# Patient Record
Sex: Male | Born: 1980 | Race: Black or African American | Hispanic: No | Marital: Single | Smoking: Current some day smoker
Health system: Southern US, Community
[De-identification: ages and names within clinical notes are randomized; demographics above are authoritative.]

## PROBLEM LIST (undated history)

## (undated) DIAGNOSIS — I10 Essential (primary) hypertension: Secondary | ICD-10-CM

## (undated) HISTORY — PX: BACK SURGERY: SHX140

## (undated) HISTORY — PX: HERNIA REPAIR: SHX51

---

## 2005-11-02 ENCOUNTER — Emergency Department: Payer: Self-pay | Admitting: Emergency Medicine

## 2007-04-30 ENCOUNTER — Emergency Department: Payer: Self-pay | Admitting: Emergency Medicine

## 2007-12-01 ENCOUNTER — Emergency Department: Payer: Self-pay | Admitting: Emergency Medicine

## 2008-07-08 ENCOUNTER — Emergency Department: Payer: Self-pay | Admitting: Emergency Medicine

## 2012-02-04 ENCOUNTER — Emergency Department: Payer: Self-pay | Admitting: *Deleted

## 2012-04-28 ENCOUNTER — Inpatient Hospital Stay: Payer: Self-pay | Admitting: Internal Medicine

## 2012-04-28 LAB — CBC
HCT: 44.1 % (ref 40.0–52.0)
HGB: 14.2 g/dL (ref 13.0–18.0)
MCH: 29.1 pg (ref 26.0–34.0)
MCHC: 32.2 g/dL (ref 32.0–36.0)
RBC: 4.87 10*6/uL (ref 4.40–5.90)

## 2012-04-28 LAB — BASIC METABOLIC PANEL
BUN: 19 mg/dL — ABNORMAL HIGH (ref 7–18)
Chloride: 107 mmol/L (ref 98–107)
Co2: 25 mmol/L (ref 21–32)
Creatinine: 1 mg/dL (ref 0.60–1.30)
EGFR (African American): >60
EGFR (Non-African Amer.): >60

## 2012-04-30 LAB — BASIC METABOLIC PANEL
Calcium, Total: 9.1 mg/dL (ref 8.5–10.1)
Co2: 28 mmol/L (ref 21–32)
Creatinine: 1.06 mg/dL (ref 0.60–1.30)
EGFR (African American): >60
EGFR (Non-African Amer.): >60
Glucose: 188 mg/dL — ABNORMAL HIGH (ref 65–99)
Osmolality: 287 (ref 275–301)
Potassium: 4 mmol/L (ref 3.5–5.1)
Sodium: 139 mmol/L (ref 136–145)

## 2014-08-14 ENCOUNTER — Emergency Department: Payer: Self-pay | Admitting: Emergency Medicine

## 2015-01-25 ENCOUNTER — Emergency Department: Payer: Self-pay | Admitting: Emergency Medicine

## 2015-02-09 ENCOUNTER — Emergency Department: Payer: Self-pay | Admitting: Emergency Medicine

## 2015-03-27 NOTE — Discharge Summary (Signed)
PATIENT NAME:  Dimas MillinWILLIAMSON, Lyndon MR#:  [id number] DATE OF BIRTH:  [birth year]  DATE OF ADMISSION:  04/28/2012 DATE OF DISCHARGE:  04/30/2012  ADMISSION DIAGNOSIS: Acute respiratory failure.   DISCHARGE DIAGNOSES:  1. Acute respiratory failure secondary to acute bronchospasm with possible chronic obstructive pulmonary exacerbation. 2. Malignant hypertension.  3. Tobacco abuse.   CONSULTATIONS: None.   LABORATORIES AT DISCHARGE: Sodium 139, potassium 4, chloride 103, bicarbonate 28, BUN 26, creatinine 1.06, glucose 188. Echocardiogram: Study was technically difficult with ejection fraction of greater than 55%. No valvular abnormalities.   HOSPITAL COURSE: 34 year old male with a strong history of tobacco abuse who presented with wheezing and bronchospasms. For further details, please refer to the History and Physical.  1. Acute hypoxic respiratory failure secondary to acute likely chronic obstructive pulmonary disease exacerbation. Chest x-ray showed no infiltrate. He did have some chronic obstructive pulmonary disease changes. The patient was on IV steroids, which were changed to p.o. steroids, inhalers, and antibiotics. He is now oxygenating well on room air.  2. Malignant hypertension. The patient was started on HCTZ and Norvasc. He will need close followup with his primary care physician.  3. Tobacco abuse. The patient was counseled on stopping smoking cigars.   DISCHARGE MEDICATIONS:  1. Azithromycin 250 mg for four days.  2. Prednisone taper starting at 60 mg, taper by 10 mg every three days.  3. Nicotine patch 14 mg per 24 hours.  4. HCTZ 50 mg daily.  5. Norvasc 10 mg daily.   DISCHARGE DIET: Low sodium.   DISCHARGE ACTIVITY: As tolerated.   DISCHARGE FOLLOWUP:  The [facility].    TIME SPENT: 35 minutes.   ____________________________ Janyth ContesSital P. Juliene PinaMody, MD spm:bjt D: 04/30/2012 15:09:39 ET T: 05/01/2012 12:08:17  ET JOB#: [id number]  cc: Gerrod Maule P. Juliene PinaMody, MD, <Dictator> [facility] Nirali Magouirk P Lusero Nordlund MD ELECTRONICALLY SIGNED 05/09/2012 12:40

## 2015-03-27 NOTE — H&P (Signed)
PATIENT NAME:  Derek Stanley, Derek Stanley MR#:  [id number] DATE OF BIRTH:  Sep 15, 1981  DATE OF ADMISSION:  04/28/2012  PRIMARY CARE PHYSICIAN: Nonlocal  REFERRING PHYSICIAN: Dr. Clemens Catholicagsdale  CHIEF COMPLAINT: Shortness of breath, cough, wheezing since last night.   HISTORY OF PRESENT ILLNESS: 34 year old African American male with a history of hypertension not taking any medication started to have shortness of breath, cough, wheezing since last night about 5:00 p.m. Patient has no asthma history before. He has no medications. They symptoms are worsening since early this morning so he came to ED for further evaluation. He was treated with prednisone, nebulizer. Oxygen saturation still low at 88%. Patient does still have shortness of breath, wheezing and cough. Patient denies any fever, chills. No headache or dizziness. No recent ill contact.   PAST MEDICAL HISTORY: Hypertension.   PAST SURGICAL HISTORY: Hernia repair.   SOCIAL HISTORY: Smokes cigar, five per day for 15 years. Denies any alcohol drinking or illicit drugs.   FAMILY HISTORY: Hypertension, diabetes.   REVIEW OF SYSTEMS: CONSTITUTIONAL: Patient denies any fever, chills. No headache or dizziness. No weakness. EYES: No double vision, blurred vision. ENT: No postnasal drip, epistaxis but has allergy to pollen and codeine. RESPIRATORY: Positive for cough, sputum, shortness of breath, wheezing. No hemoptysis. CARDIOVASCULAR: No chest pain, palpitation, orthopnea, nocturnal dyspnea. No leg edema. GASTROINTESTINAL: No abdominal pain, nausea, vomiting, or diarrhea. No melena or bloody stool. GENITOURINARY: No dysuria, hematuria, incontinence. ENDOCRINE: No polyuria, polydipsia or heat or cold importance. NEUROLOGY: No syncope, loss of consciousness or seizure. HEMATOLOGY: No easy bruising or bleeding.   ALLERGIES: Codeine.   MEDICATIONS: No.  PHYSICAL EXAMINATION:  VITALS: Temperature 98.2, blood pressure 198/105, pulse 88, respirations 19, oxygen  saturation 88% on room air.   GENERAL: Patient is alert, awake, oriented in mild respiratory distress on oxygen by nasal cannula.   HEENT: Pupils are round, equal, reactive to light and accommodation. Moist oral mucosa. Clear oropharynx.  NECK: Supple. No JVD or carotid bruits. No lymphadenopathy. No thyromegaly.    CARDIOVASCULAR: S1, S2 regular rate, rhythm. No murmurs, gallops.   RESPIRATORY: Positive for bilateral air entry, bilateral diffuse expiratory wheezing. No rales or crackles.   ABDOMEN: Soft. No distention or tenderness. No organomegaly. Bowel sounds present.   EXTREMITIES: No edema, clubbing, or cyanosis. No calf tenderness. Bilateral strong pedal pulses.   SKIN: No rash or jaundice.   NEUROLOGIC: Alert and oriented x3. No focal deficit. Power 5/5. Sensation intact. Deep tendon reflexes 2+.   LABORATORY, DIAGNOSTIC, AND RADIOLOGICAL DATA: Glucose 94, BUN 19, creatinine 1.0. Electrolytes are normal. CBC normal   IMPRESSION:  1. Acute asthma, hypoxia 2. Hypertensive urgency.  3. Tobacco use.     PLAN OF TREATMENT:  1. Patient will be admitted to medical floor. We will continue oxygen by nasal cannula, give nebulizer, Singulair, Solu-Medrol.   2. For hypertension, we will start HCTZ and give hydralazine IV p.r.n. if blood pressure systolic more than 180 or diastolic more than 100.  3. Smoking cessation was counseled.   Discussed the patient's situation and the plan of treatment with patient.   TIME SPENT: About 50 minutes.   ____________________________ Shaune PollackQing Ginger Leeth, MD qc:cms D: 04/28/2012 12:47:05 ET T: 04/28/2012 13:18:07 ET JOB#: [id number]  cc: Shaune PollackQing Hedaya Latendresse, MD, <Dictator> Shaune PollackQING Gricelda Foland MD ELECTRONICALLY SIGNED 04/28/2012 21:09

## 2015-05-09 ENCOUNTER — Emergency Department
Admission: EM | Admit: 2015-05-09 | Discharge: 2015-05-09 | Disposition: A | Discharge status: Home / Self Care | Payer: BLUE CROSS/BLUE SHIELD | Attending: Emergency Medicine | Admitting: Emergency Medicine

## 2015-05-09 ENCOUNTER — Encounter: Payer: Self-pay | Admitting: Emergency Medicine

## 2015-05-09 ENCOUNTER — Emergency Department: Payer: BLUE CROSS/BLUE SHIELD

## 2015-05-09 DIAGNOSIS — I10 Essential (primary) hypertension: Secondary | ICD-10-CM | POA: Diagnosis not present

## 2015-05-09 DIAGNOSIS — M545 Low back pain: Secondary | ICD-10-CM | POA: Diagnosis present

## 2015-05-09 DIAGNOSIS — Z720 Tobacco use: Secondary | ICD-10-CM | POA: Insufficient documentation

## 2015-05-09 DIAGNOSIS — M5116 Intervertebral disc disorders with radiculopathy, lumbar region: Secondary | ICD-10-CM | POA: Insufficient documentation

## 2015-05-09 HISTORY — DX: Essential (primary) hypertension: I10

## 2015-05-09 LAB — COMPREHENSIVE METABOLIC PANEL
ALBUMIN: 4 g/dL (ref 3.5–5.0)
ALT: 23 U/L (ref 17–63)
AST: 23 U/L (ref 15–41)
Alkaline Phosphatase: 45 U/L (ref 38–126)
Anion gap: 6 (ref 5–15)
BILIRUBIN TOTAL: 0.2 mg/dL — AB (ref 0.3–1.2)
BUN: 17 mg/dL (ref 6–20)
CHLORIDE: 106 mmol/L (ref 101–111)
CO2: 28 mmol/L (ref 22–32)
Calcium: 8.8 mg/dL — ABNORMAL LOW (ref 8.9–10.3)
Creatinine, Ser: 1.15 mg/dL (ref 0.61–1.24)
GFR calc Af Amer: >60 mL/min (ref >60)
GFR calc non Af Amer: >60 mL/min (ref >60)
GLUCOSE: 103 mg/dL — AB (ref 65–99)
Potassium: 3.6 mmol/L (ref 3.5–5.1)
Sodium: 140 mmol/L (ref 135–145)
TOTAL PROTEIN: 7.2 g/dL (ref 6.5–8.1)

## 2015-05-09 MED ORDER — OXYCODONE-ACETAMINOPHEN 5-325 MG PO TABS
2.0000 | ORAL_TABLET | Freq: Once | ORAL | Status: AC
Start: 2015-05-09 — End: 2015-05-09
  Administered 2015-05-09: 2 via ORAL

## 2015-05-09 MED ORDER — GADOBENATE DIMEGLUMINE 529 MG/ML IV SOLN
20.0000 mL | Freq: Once | INTRAVENOUS | Status: AC | PRN
  Administered 2015-05-09: 20 mL via INTRAVENOUS
  Filled 2015-05-09: qty 20

## 2015-05-09 MED ORDER — OXYCODONE-ACETAMINOPHEN 5-325 MG PO TABS
ORAL_TABLET | ORAL | Status: AC
  Filled 2015-05-09: qty 2

## 2015-05-09 MED ORDER — DIAZEPAM 5 MG PO TABS
10.0000 mg | ORAL_TABLET | Freq: Once | ORAL | Status: AC
  Administered 2015-05-09: 10 mg via ORAL

## 2015-05-09 MED ORDER — DIAZEPAM 5 MG PO TABS
ORAL_TABLET | ORAL | Status: AC
  Filled 2015-05-09: qty 2

## 2015-05-09 MED ORDER — OXYCODONE-ACETAMINOPHEN 5-325 MG PO TABS: 1.0000 | ORAL_TABLET | ORAL | Status: AC | PRN

## 2015-05-09 NOTE — ED Notes (Signed)
C/O "sciatic nerve" pain.  C/O left lower back pain that shoots down left leg.  C/O numbness to left leg x 1 week.  Denies difficulty urinating.  C/O pain with straining to move bowels.  States taking Ibuprofen 800 mg for pain, last taken 05/09/15 @ 2000.  States ibuprofen is not helping with pain. Seen at "clinic" today and referred to ED for more work up.

## 2015-05-09 NOTE — ED Provider Notes (Signed)
[facility] Emergency Department Provider Note  ____________________________________________  Time seen: Approximately 6:33 PM  I have reviewed the triage vital signs and the nursing notes.   HISTORY  Chief Complaint Back Pain    HPI Derek Stanley is a 34 y.o. male who presents for evaluation of low back pain. Patient reports his pains and there for about 3 weeks progressively getting worse. Rates the pain as a 10 over 10 pain is described as sharp and occurring all day long. Exacerbated by extension and flexion standing twisting and walking. Gums are improved by nothing. The patient is experiencing left leg numbness and saddle anesthesia which is new in the past few days. Denies any weakness in his arms or legs. No bowel or bladder incontinence. He reports that he is unable to feel himself urinate.   Past Medical History  Diagnosis Date  . Hypertension     There are no active problems to display for this patient.   Past Surgical History  Procedure Laterality Date  . Hernia repair      Current Outpatient Rx  Name  Route  Sig  Dispense  Refill  . oxyCODONE-acetaminophen (ROXICET) 5-325 MG per tablet   Oral   Take 1-2 tablets by mouth every 4 (four) hours as needed for severe pain.   25 tablet   0     Allergies Review of patient's allergies indicates no known allergies.  No family history on file.  Social History History  Substance Use Topics  . Smoking status: Current Every Day Smoker -- 1.00 packs/day    Types: Cigars  . Smokeless tobacco: Not on file  . Alcohol Use: Yes    Review of Systems Constitutional: No fever/chills Eyes: No visual changes. ENT: No sore throat. Cardiovascular: Denies chest pain. Respiratory: Denies shortness of breath. Gastrointestinal: No abdominal pain.  No nausea, no vomiting.  No diarrhea.  No constipation. Genitourinary: Negative for dysuria. Musculoskeletal: Positive for back pain. Negative for  arthralgias, muscle weakness, as or neck pain. Skin: Negative for rash. Neurological: Negative for headaches, positive for numbness in the genitalia and lower left leg. Positive straight leg raise on the left.  10-point ROS otherwise negative.  ____________________________________________   PHYSICAL EXAM:  VITAL SIGNS: ED Triage Vitals  Enc Vitals Group     BP 05/09/15 1823 187/123 mmHg     Pulse Rate 05/09/15 1823 77     Resp --      Temp 05/09/15 1823 98.3 F (36.8 C)     Temp Source 05/09/15 1823 Oral     SpO2 05/09/15 1823 98 %     Weight 05/09/15 1823 260 lb (117.935 kg)     Height 05/09/15 1823  (1.905 m)     Head Cir --      Peak Flow --      Pain Score 05/09/15 1824 10     Pain Loc --      Pain Edu? --      Excl. in GC? --     Constitutional: Alert and oriented. Well appearing and in no acute distress. Eyes: Conjunctivae are normal. PERRL. EOMI. Head: Atraumatic. Nose: No congestion/rhinnorhea. Mouth/Throat: Mucous membranes are moist.  Oropharynx non-erythematous. Neck: No stridor.   Cardiovascular: Normal rate, regular rhythm. Grossly normal heart sounds.  Good peripheral circulation. Respiratory: Normal respiratory effort.  No retractions. Lungs CTAB. Gastrointestinal: Soft and nontender. No distention. No abdominal bruits. No CVA tenderness. Musculoskeletal: No lower extremity tenderness nor edema.  No joint  effusions. Neurologic:  Normal speech and language. No gross focal neurologic deficits are appreciated. Speech is normal. No gait instability. Positive for straight leg raise on the left. Skin:  Skin is warm, dry and intact. No rash noted. Psychiatric: Mood and affect are normal. Speech and behavior are normal.  ____________________________________________   LABS (all labs ordered are listed, but only abnormal results are displayed)  Labs Reviewed  COMPREHENSIVE METABOLIC PANEL - Abnormal; Notable for the following:    Glucose, Bld 103 (*)     Calcium 8.8 (*)    Total Bilirubin 0.2 (*)    All other components within normal limits   ____________________________________________  EKG  Deferred ____________________________________________  RADIOLOGY  Ordered MRI on arrival. 1920 PM: Phone call to radiology requesting update on MRI status. Instructed to page the MRI tech. MRI results demonstrated disc desiccation and bulging disc. No cauda equina syndrome. ____________________________________________   PROCEDURES  Procedure(s) performed: None  Critical Care performed: No  ____________________________________________   INITIAL IMPRESSION / ASSESSMENT AND PLAN / ED COURSE  Pertinent labs & imaging results that were available during my care of the patient were reviewed by me and considered in my medical decision making (see chart for details).  Cussed MRI results with patient and spouse. Patient to diagnosed with bulging disc and disc desiccation. He is to follow-up with his PCP for referral to neurosurgery for evaluation. Rx given for Percocet prior to leaving. Patient voices no other emergency medical complaints at this time and will return to the ER if symptoms worsen. ____________________________________________   FINAL CLINICAL IMPRESSION(S) / ED DIAGNOSES  Final diagnoses:  Lumbar disc disease with radiculopathy      Evangeline Dakinharles M Beers, PA-C 05/09/15 2348  Maurilio LovelyNoelle McLaurin, MD 05/10/15 [id number]

## 2016-08-19 ENCOUNTER — Emergency Department
Admission: EM | Admit: 2016-08-19 | Discharge: 2016-08-19 | Disposition: A | Discharge status: Home / Self Care | Payer: BLUE CROSS/BLUE SHIELD | Attending: Emergency Medicine | Admitting: Emergency Medicine

## 2016-08-19 ENCOUNTER — Encounter: Payer: Self-pay | Admitting: Emergency Medicine

## 2016-08-19 DIAGNOSIS — F17290 Nicotine dependence, other tobacco product, uncomplicated: Secondary | ICD-10-CM | POA: Insufficient documentation

## 2016-08-19 DIAGNOSIS — J029 Acute pharyngitis, unspecified: Secondary | ICD-10-CM | POA: Insufficient documentation

## 2016-08-19 DIAGNOSIS — H9202 Otalgia, left ear: Secondary | ICD-10-CM | POA: Diagnosis present

## 2016-08-19 DIAGNOSIS — I10 Essential (primary) hypertension: Secondary | ICD-10-CM | POA: Diagnosis not present

## 2016-08-19 DIAGNOSIS — H6692 Otitis media, unspecified, left ear: Secondary | ICD-10-CM

## 2016-08-19 DIAGNOSIS — Z791 Long term (current) use of non-steroidal anti-inflammatories (NSAID): Secondary | ICD-10-CM | POA: Insufficient documentation

## 2016-08-19 LAB — POCT RAPID STREP A: Streptococcus, Group A Screen (Direct): NEGATIVE

## 2016-08-19 MED ORDER — FLUTICASONE PROPIONATE 50 MCG/ACT NA SUSP: 2.0000 | Freq: Every day | NASAL | 0 refills | Status: AC

## 2016-08-19 MED ORDER — MAGIC MOUTHWASH W/LIDOCAINE: 5.0000 mL | Freq: Four times a day (QID) | ORAL | 0 refills | Status: AC | PRN

## 2016-08-19 MED ORDER — AMOXICILLIN 875 MG PO TABS: 875.0000 mg | ORAL_TABLET | Freq: Two times a day (BID) | ORAL | 0 refills | Status: AC

## 2016-08-19 NOTE — ED Provider Notes (Signed)
[facility] Emergency Department Provider Note  ____________________________________________  Time seen: Approximately 1:08 PM  I have reviewed the triage vital signs and the nursing notes.   HISTORY  Chief Complaint Sore Throat    HPI Derek Stanley is a 35 y.o. male , NAD, presents to the emergency department with three-day history of sore throat and left ear pain. Patient states he was sick with an upper respiratory infection approximately one week ago in which he had fevers at home but that resolved. A few days after resolution of that illness he began to experience left-sided sore throat and left ear pain. Has not had any fevers, chills, body aches, headaches. Does note he has pain with swallowing but no difficulty breathing. Denies any swelling about the lips/tongue/throat. Has not had any chest pain, shortness breath, wheezing. Denies abdominal pain, nausea or vomiting nor any rashes. No other sick contacts. Has not taken anything over-the-counter that is made his symptoms better or worse.   Past Medical History:  Diagnosis Date  . Hypertension     There are no active problems to display for this patient.   Past Surgical History:  Procedure Laterality Date  . HERNIA REPAIR      Prior to Admission medications   Medication Sig Start Date End Date Taking? Authorizing Provider  amoxicillin (AMOXIL) 875 MG tablet Take 1 tablet (875 mg total) by mouth 2 (two) times daily. 08/19/16   Prakash Kimberling L Doyce Saling, PA-C  fluticasone (FLONASE) 50 MCG/ACT nasal spray Place 2 sprays into both nostrils daily. 08/19/16   Lorette Peterkin L Nikitia Asbill, PA-C  magic mouthwash w/lidocaine SOLN Take 5 mLs by mouth 4 (four) times daily as needed for mouth pain. 08/19/16   Jaleeyah Munce L Eino Whitner, PA-C  oxyCODONE-acetaminophen (ROXICET) 5-325 MG per tablet Take 1-2 tablets by mouth every 4 (four) hours as needed for severe pain. 05/09/15   Evangeline Dakinharles M Beers, PA-C    Allergies Review of patient's allergies  indicates no known allergies.  No family history on file.  Social History Social History  Substance Use Topics  . Smoking status: Current Every Day Smoker    Packs/day: 1.00    Types: Cigars  . Smokeless tobacco: Never Used  . Alcohol use Yes     Review of Systems  Constitutional: No fever/chills ENT: Positive sore throat, left ear pain. No nasal congestion, runny nose, sinus pressure, ear drainage. Cardiovascular: No chest pain. Respiratory: No cough, chest congestion. No shortness of breath. No wheezing.  Gastrointestinal: No abdominal pain.  No nausea, vomiting.   Musculoskeletal: Negative for neck pain, general myalgias.  Skin: Negative for rash. Neurological: Negative for headaches. 10-point ROS otherwise negative.  ____________________________________________   PHYSICAL EXAM:  VITAL SIGNS: ED Triage Vitals  Enc Vitals Group     BP 08/19/16 1236 (!) 164/108     Pulse Rate 08/19/16 1236 89     Resp 08/19/16 1236 16     Temp 08/19/16 1236 99.2 F (37.3 C)     Temp Source 08/19/16 1236 Oral     SpO2 08/19/16 1236 99 %     Weight 08/19/16 1236 280 lb (127 kg)     Height 08/19/16 1236 6\' 3"  (1.905 m)     Head Circumference --      Peak Flow --      Pain Score 08/19/16 1237 7     Pain Loc --      Pain Edu? --      Excl. in GC? --  Constitutional: Alert and oriented. Well appearing and in no acute distress. Eyes: Conjunctivae are normal.  Head: Atraumatic. ENT:      Ears: Right TM visualized without erythema, bulging, effusion, perforation. Left TM visualized with mild erythema, mild bulging and trace serous effusion but no perforation.      Nose: No congestion/rhinnorhea. Turbinates are moderately injected.      Mouth/Throat: Mucous membranes are moist. Left tonsil with moderate erythema and mild swelling but no exudate. Posterior pharynx without erythema, swelling. Uvula is midline. Airways patent. Voice quality and pitch are normal. Neck: No stridor,  carotid bruits. No cervical spine tenderness to palpation. Supple with full range of motion. Hematological/Lymphatic/Immunilogical: No cervical lymphadenopathy. Cardiovascular: Normal rate, regular rhythm. Normal S1 and S2.  Good peripheral circulation. Respiratory: Normal respiratory effort without tachypnea or retractions. Lungs CTAB with breath sounds noted in all lung fields. No wheeze, rhonchi, rales. Neurologic:  Normal speech and language. No gross focal neurologic deficits are appreciated.  Skin:  Skin is warm, dry and intact. No rash noted. Psychiatric: Mood and affect are normal. Speech and behavior are normal. Patient exhibits appropriate insight and judgement.   ____________________________________________   LABS (all labs ordered are listed, but only abnormal results are displayed)  Labs Reviewed  POCT RAPID STREP A   ____________________________________________  EKG  None ____________________________________________  RADIOLOGY  None ____________________________________________    PROCEDURES  Procedure(s) performed: None   Procedures   Medications - No data to display   ____________________________________________   INITIAL IMPRESSION / ASSESSMENT AND PLAN / ED COURSE  Pertinent labs & imaging results that were available during my care of the patient were reviewed by me and considered in my medical decision making (see chart for details).  Clinical Course    Patient's diagnosis is consistent with Acute pharyngitis and left otitis media. Patient will be discharged home with prescriptions for amoxicillin, Flonase and Magic mouthwash with lidocaine to use as directed. May take over-the-counter Tylenol or Motrin as needed for pain or fever. Patient is to follow up with [facility] if symptoms persist past this treatment course. Patient is given ED precautions to return to the ED for any worsening or new symptoms.       ____________________________________________  FINAL CLINICAL IMPRESSION(S) / ED DIAGNOSES  Final diagnoses:  Acute pharyngitis, unspecified pharyngitis type  Acute left otitis media, recurrence not specified, unspecified otitis media type      NEW MEDICATIONS STARTED DURING THIS VISIT:  Discharge Medication List as of 08/19/2016  1:15 PM    START taking these medications   Details  amoxicillin (AMOXIL) 875 MG tablet Take 1 tablet (875 mg total) by mouth 2 (two) times daily., Starting Sun 08/19/2016, Print    fluticasone (FLONASE) 50 MCG/ACT nasal spray Place 2 sprays into both nostrils daily., Starting Sun 08/19/2016, Print    magic mouthwash w/lidocaine SOLN Take 5 mLs by mouth 4 (four) times daily as needed for mouth pain., Starting Sun 08/19/2016, [employer], PA-C 08/19/16 1402    Sharman Cheek, MD 08/21/16 2232

## 2016-08-19 NOTE — Discharge Instructions (Signed)
May take Tylenol or ibuprofen as needed for pain. 

## 2016-08-19 NOTE — ED Triage Notes (Signed)
C/O sore throat x 2-3 days.  Patient states he was sick last week with fevers, but no fevers this week..Marland Kitchen

## 2016-08-19 NOTE — ED Triage Notes (Signed)
Pt c/o sore throat, but is able to swallow but reports pain with doing so.

## 2016-11-12 ENCOUNTER — Ambulatory Visit
Admission: EM | Admit: 2016-11-12 | Discharge: 2016-11-12 | Disposition: A | Discharge status: Home / Self Care | Payer: BLUE CROSS/BLUE SHIELD | Attending: Family Medicine | Admitting: Family Medicine

## 2016-11-12 ENCOUNTER — Encounter: Payer: Self-pay | Admitting: *Deleted

## 2016-11-12 DIAGNOSIS — G510 Bell's palsy: Secondary | ICD-10-CM | POA: Diagnosis not present

## 2016-11-12 DIAGNOSIS — R030 Elevated blood-pressure reading, without diagnosis of hypertension: Secondary | ICD-10-CM

## 2016-11-12 MED ORDER — VALACYCLOVIR HCL 1 G PO TABS: 1000.0000 mg | ORAL_TABLET | Freq: Three times a day (TID) | ORAL | 0 refills | Status: AC

## 2016-11-12 MED ORDER — PREDNISONE 20 MG PO TABS: ORAL_TABLET | ORAL | 0 refills | Status: AC

## 2016-11-12 NOTE — ED Triage Notes (Signed)
Onset of right sided facial numbness 2 nights ago with facial ptosis, and difficulty closing right eye. Negative for arm drift, negative slurred speech, normal gait, equal grips. Pt denies any part of his body affected.

## 2016-11-12 NOTE — ED Provider Notes (Signed)
[facility]    CSN: [id number] Arrival date & time: 11/12/16  1401     History   Chief Complaint Chief Complaint  Patient presents with  . Facial Droop    HPI Derek Stanley is a 35 y.o. male.   35 yo male with a c/o 2 days of right sided facial weakness, difficulty closing his right eye. States symptoms began suddenly. Denies any injuries, trauma, fevers, chills, vision changes, difficulty with speech or swallowing, extremity weakness.   Patient states he has a follow up appointment tomorrow with his PCP for follow up on high blood pressure. Denies chest pains, shortness of breath, headaches, vision changes.    The history is provided by the patient.    Past Medical History:  Diagnosis Date  . Hypertension     There are no active problems to display for this patient.   Past Surgical History:  Procedure Laterality Date  . BACK SURGERY    . HERNIA REPAIR         Home Medications    Prior to Admission medications   Medication Sig Start Date End Date Taking? Authorizing Provider  amoxicillin (AMOXIL) 875 MG tablet Take 1 tablet (875 mg total) by mouth 2 (two) times daily. 08/19/16   Jami L Hagler, PA-C  fluticasone (FLONASE) 50 MCG/ACT nasal spray Place 2 sprays into both nostrils daily. 08/19/16   Jami L Hagler, PA-C  magic mouthwash w/lidocaine SOLN Take 5 mLs by mouth 4 (four) times daily as needed for mouth pain. 08/19/16   Jami L Hagler, PA-C  oxyCODONE-acetaminophen (ROXICET) 5-325 MG per tablet Take 1-2 tablets by mouth every 4 (four) hours as needed for severe pain. 05/09/15   Charmayne Sheerharles M Beers, PA-C  predniSONE (DELTASONE) 20 MG tablet 3 tabs po qd for 7 days 11/12/16   Payton Mccallumrlando Chimene Salo, MD  valACYclovir (VALTREX) 1000 MG tablet Take 1 tablet (1,000 mg total) by mouth 3 (three) times daily. 11/12/16   Payton Mccallumrlando Ashaki Frosch, MD    Family History History reviewed. No pertinent family history.  Social History Social History  Substance Use Topics  .  Smoking status: Current Some Day Smoker    Packs/day: 1.00    Types: Cigars  . Smokeless tobacco: Never Used  . Alcohol use Yes     Allergies   Vicodin [hydrocodone-acetaminophen]   Review of Systems Review of Systems   Physical Exam Triage Vital Signs ED Triage Vitals  Enc Vitals Group     BP 11/12/16 1503 (!) 179/122     Pulse Rate 11/12/16 1503 68     Resp 11/12/16 1503 16     Temp 11/12/16 1503 98.2 F (36.8 C)     Temp Source 11/12/16 1503 Oral     SpO2 11/12/16 1503 100 %     Weight 11/12/16 1505 280 lb (127 kg)     Height 11/12/16 1505 6\' 3"  (1.905 m)     Head Circumference --      Peak Flow --      Pain Score 11/12/16 1550 0     Pain Loc --      Pain Edu? --      Excl. in GC? --    No data found.   Updated Vital Signs BP (!) 180/113 (BP Location: Left Arm)   Pulse 71   Temp 98.2 F (36.8 C) (Oral)   Resp 16   Ht 6\' 3"  (1.905 m)   Wt 280 lb (127 kg)   SpO2 100%  BMI 35.00 kg/m   Visual Acuity Right Eye Distance:   Left Eye Distance:   Bilateral Distance:    Right Eye Near:   Left Eye Near:    Bilateral Near:     Physical Exam  Constitutional: He is oriented to person, place, and time. He appears well-developed and well-nourished. No distress.  HENT:  Head: Normocephalic and atraumatic.  Right Ear: Tympanic membrane, external ear and ear canal normal.  Left Ear: Tympanic membrane, external ear and ear canal normal.  Nose: Nose normal.  Mouth/Throat: Uvula is midline, oropharynx is clear and moist and mucous membranes are normal. No oropharyngeal exudate or tonsillar abscesses.  Eyes: Conjunctivae and EOM are normal. Pupils are equal, round, and reactive to light. Right eye exhibits no discharge. Left eye exhibits no discharge. No scleral icterus.  Neck: Normal range of motion. Neck supple. No tracheal deviation present. No thyromegaly present.  Cardiovascular: Normal rate, regular rhythm and normal heart sounds.   Pulmonary/Chest: Effort  normal and breath sounds normal. No stridor. No respiratory distress. He has no wheezes. He has no rales. He exhibits no tenderness.  Lymphadenopathy:    He has no cervical adenopathy.  Neurological: He is alert and oriented to person, place, and time. He displays no atrophy, no tremor and normal reflexes. No sensory deficit. He displays no seizure activity. Coordination normal.  Right facial sided facial muscle weakness including forehead; no other neurologic findings  Skin: Skin is warm and dry. No rash noted. He is not diaphoretic.  Nursing note and vitals reviewed.    UC Treatments / Results  Labs (all labs ordered are listed, but only abnormal results are displayed) Labs Reviewed - No data to display  EKG  EKG Interpretation None       Radiology No results found.  Procedures Procedures (including critical care time)  Medications Ordered in UC Medications - No data to display   Initial Impression / Assessment and Plan / UC Course  I have reviewed the triage vital signs and the nursing notes.  Pertinent labs & imaging results that were available during my care of the patient were reviewed by me and considered in my medical decision making (see chart for details).  Clinical Course       Final Clinical Impressions(s) / UC Diagnoses   Final diagnoses:  Bell's palsy  Elevated blood pressure reading    New Prescriptions Discharge Medication List as of 11/12/2016  3:48 PM    START taking these medications   Details  predniSONE (DELTASONE) 20 MG tablet 3 tabs po qd for 7 days, Normal    valACYclovir (VALTREX) 1000 MG tablet Take 1 tablet (1,000 mg total) by mouth 3 (three) times daily., Starting Mon 11/12/2016, Normal       1.diagnosis reviewed with patient 2. rx as per orders above; reviewed possible side effects, interactions, risks and benefits  3. Recommend supportive treatment with over the counter eye lubricant drops during the day and eye lubricant  ophthalmic ointment at night 4. Follow-up with Primary Care physician as scheduled tomorrow for recheck blood pressure and follow up in 5-7 days with PCP for recheck on Bell's Palsy improvement/resolution 5. F/u here prn   Payton Mccallumrlando Debra Calabretta, MD 11/12/16 [phone]

## 2016-12-26 IMAGING — CR DG CHEST 2V
1 series · 2 of 2 positions shown · non-contrast
Comparison: 01/25/2015

CLINICAL DATA: Fever and cough after being diagnosed with a flu 2
weeks ago. Right-sided ear pressure. Smoker.

EXAM:
CHEST  2 VIEW

[Series 3: w chest pa · 0.14mm/px · 2 of 2 slices shown]
[im 1/2]
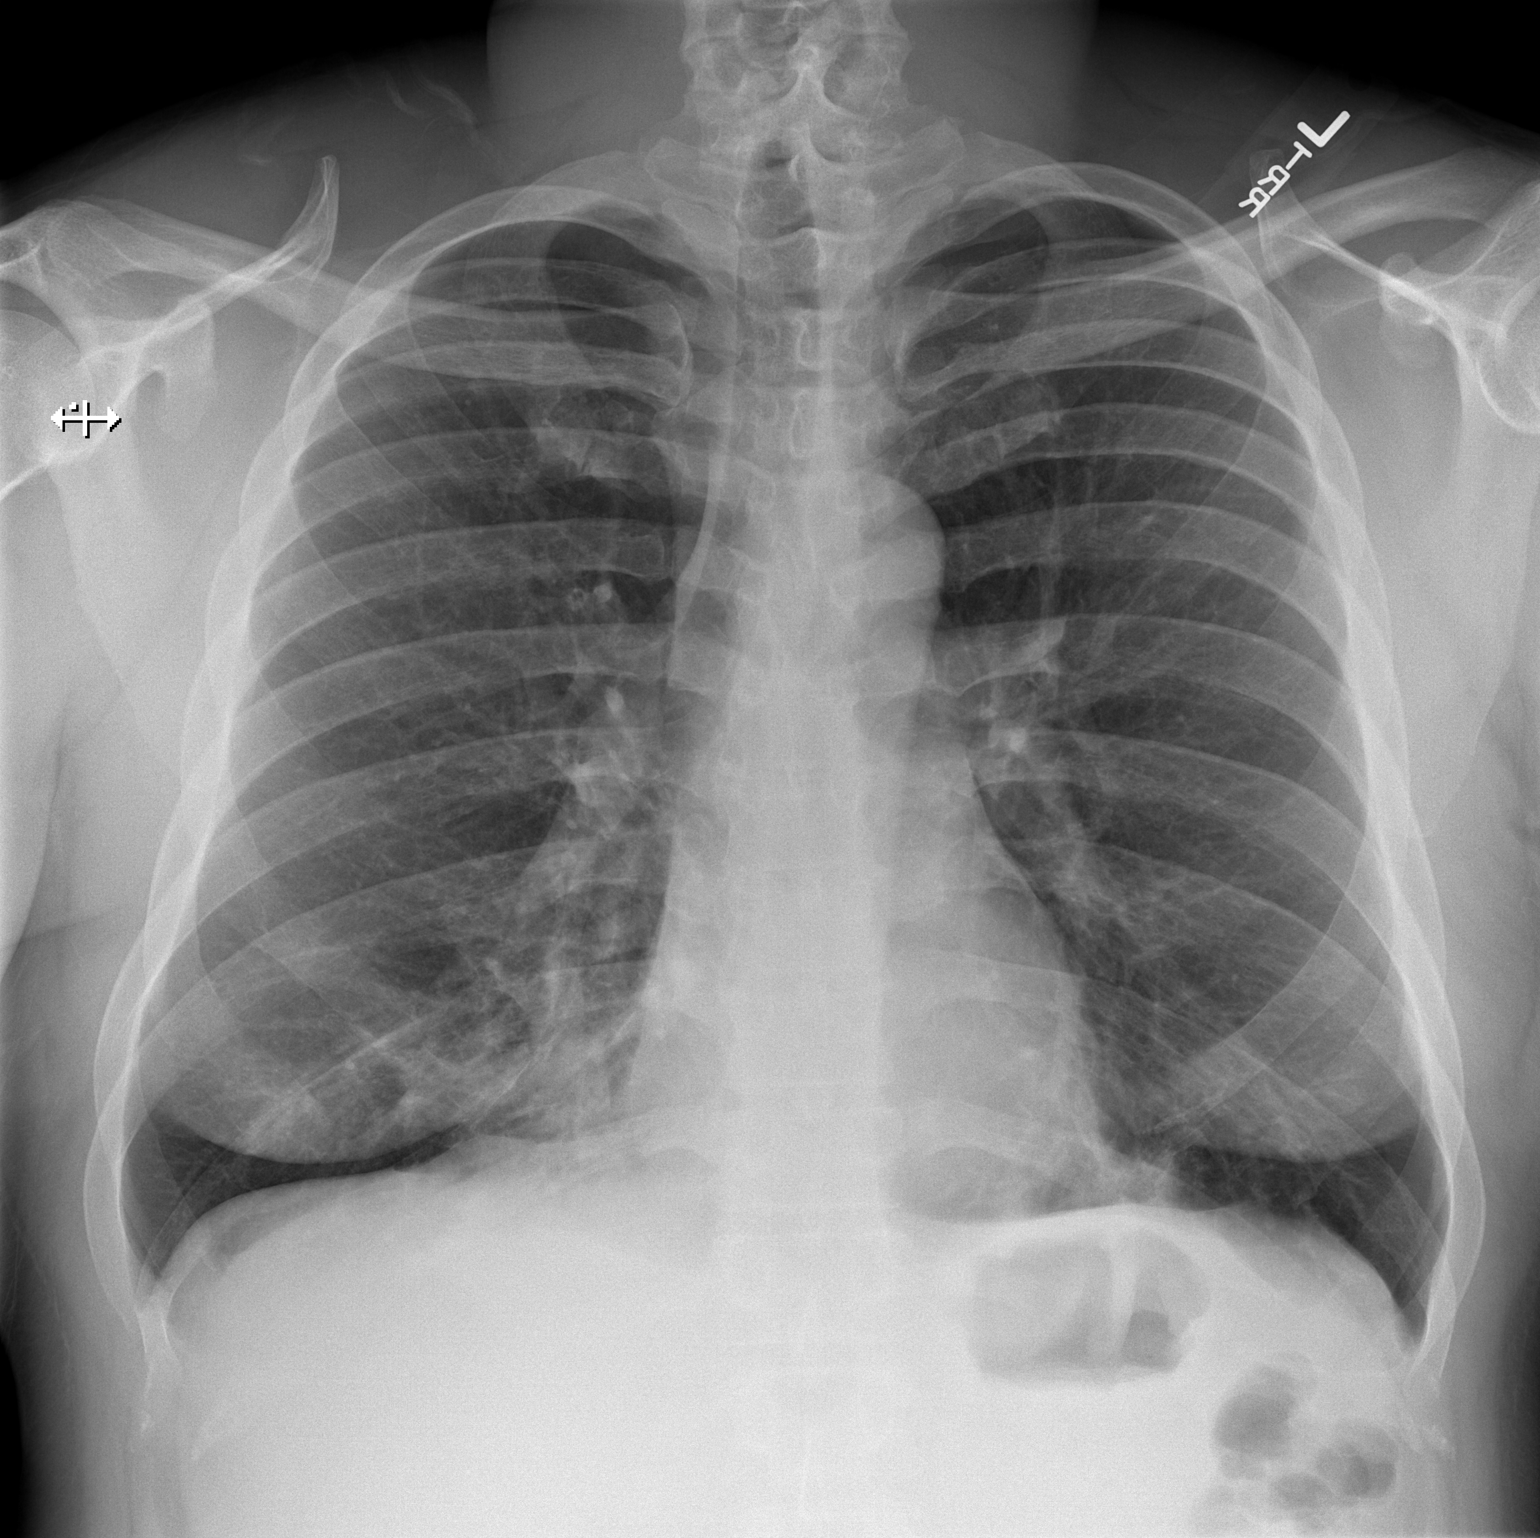
[im 2/2]
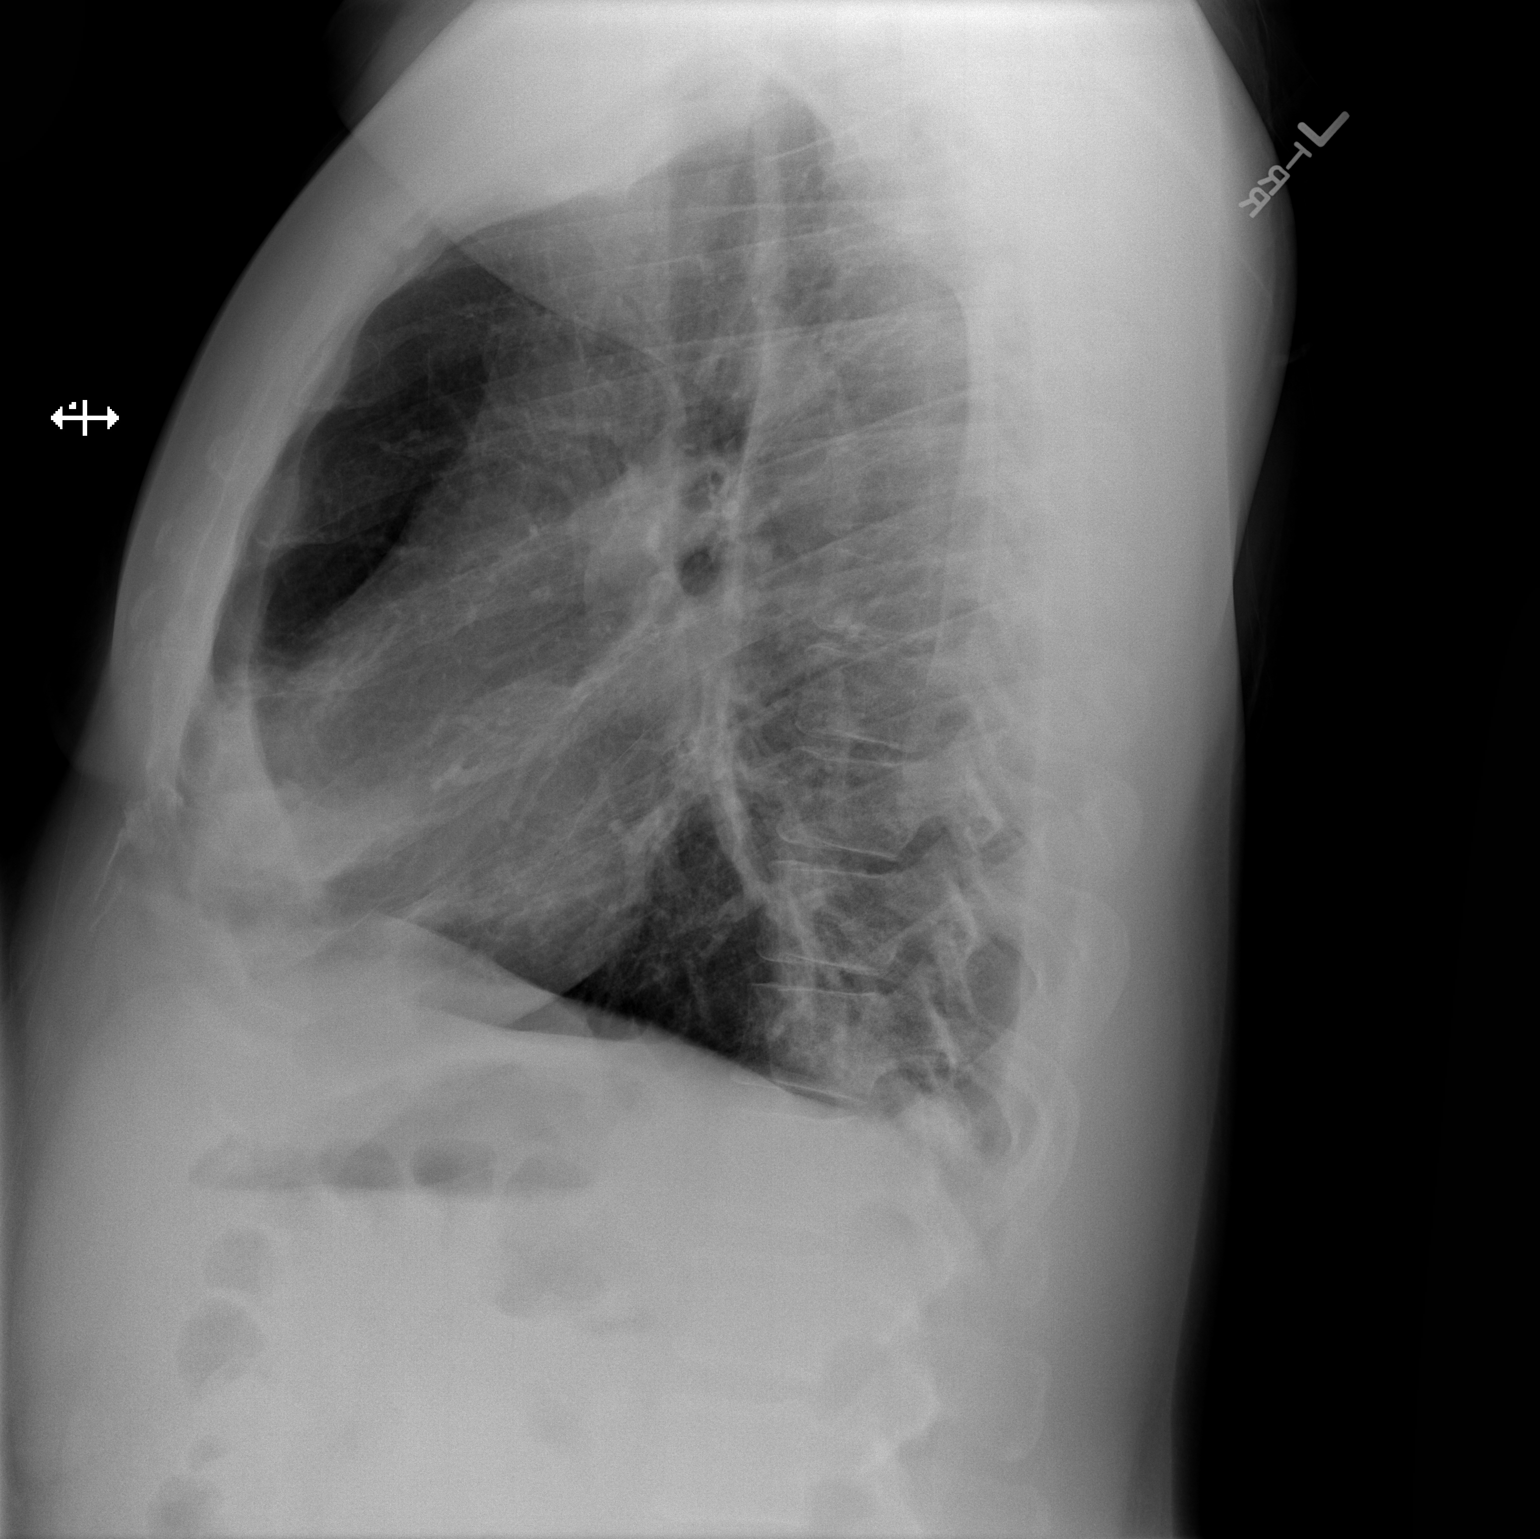

[2 of 2 positions shown; findings below may reference images not displayed]

FINDINGS: A focal airspace disease in the medial right middle lung. This is
consistent with pneumonia in the appropriate clinical setting. This
is new since previous study. Hyperinflation suggesting emphysematous
change. No blunting of costophrenic angles. No pneumothorax. Normal
heart size and pulmonary vascularity. Mediastinal contours appear
intact
IMPRESSION: Airspace disease in the medial aspect right middle lung consistent
with pneumonia in the appropriate clinical setting. Underlying
emphysematous changes.
